# Patient Record
Sex: Female | Born: 1955 | Race: White | Hispanic: No | Smoking: Never smoker
Health system: Southern US, Community
[De-identification: ages and names within clinical notes are randomized; demographics above are authoritative.]

## PROBLEM LIST (undated history)

## (undated) DIAGNOSIS — K900 Celiac disease: Secondary | ICD-10-CM

## (undated) DIAGNOSIS — Z862 Personal history of diseases of the blood and blood-forming organs and certain disorders involving the immune mechanism: Secondary | ICD-10-CM

## (undated) DIAGNOSIS — J45909 Unspecified asthma, uncomplicated: Secondary | ICD-10-CM

## (undated) HISTORY — DX: Unspecified asthma, uncomplicated: J45.909

## (undated) HISTORY — DX: Celiac disease: K90.0

## (undated) HISTORY — DX: Personal history of diseases of the blood and blood-forming organs and certain disorders involving the immune mechanism: Z86.2

---

## 1995-09-02 HISTORY — PX: TUBAL LIGATION: SHX77

## 1999-05-28 ENCOUNTER — Other Ambulatory Visit: Admission: RE | Admit: 1999-05-28 | Discharge: 1999-05-28 | Payer: Self-pay | Admitting: Gynecology

## 2000-06-30 ENCOUNTER — Other Ambulatory Visit: Admission: RE | Admit: 2000-06-30 | Discharge: 2000-06-30 | Payer: Self-pay | Admitting: Gynecology

## 2001-11-23 ENCOUNTER — Other Ambulatory Visit: Admission: RE | Admit: 2001-11-23 | Discharge: 2001-11-23 | Payer: Self-pay | Admitting: Gynecology

## 2002-12-19 ENCOUNTER — Other Ambulatory Visit: Admission: RE | Admit: 2002-12-19 | Discharge: 2002-12-19 | Payer: Self-pay | Admitting: Gynecology

## 2003-12-14 ENCOUNTER — Other Ambulatory Visit: Admission: RE | Admit: 2003-12-14 | Discharge: 2003-12-14 | Payer: Self-pay | Admitting: Gynecology

## 2004-06-06 ENCOUNTER — Other Ambulatory Visit: Admission: RE | Admit: 2004-06-06 | Discharge: 2004-06-06 | Payer: Self-pay | Admitting: Gynecology

## 2004-12-24 ENCOUNTER — Ambulatory Visit: Payer: Self-pay | Admitting: Oncology

## 2005-01-16 ENCOUNTER — Other Ambulatory Visit: Admission: RE | Admit: 2005-01-16 | Discharge: 2005-01-16 | Payer: Self-pay | Admitting: Gynecology

## 2006-04-16 ENCOUNTER — Other Ambulatory Visit: Admission: RE | Admit: 2006-04-16 | Discharge: 2006-04-16 | Payer: Self-pay | Admitting: Gynecology

## 2007-06-08 ENCOUNTER — Other Ambulatory Visit: Admission: RE | Admit: 2007-06-08 | Discharge: 2007-06-08 | Payer: Self-pay | Admitting: Gynecology

## 2008-07-17 ENCOUNTER — Other Ambulatory Visit: Admission: RE | Admit: 2008-07-17 | Discharge: 2008-07-17 | Payer: Self-pay | Admitting: Gynecology

## 2008-07-21 ENCOUNTER — Encounter: Admission: RE | Admit: 2008-07-21 | Discharge: 2008-07-21 | Payer: Self-pay | Admitting: Family Medicine

## 2014-04-11 ENCOUNTER — Ambulatory Visit
Admission: RE | Admit: 2014-04-11 | Discharge: 2014-04-11 | Disposition: A | Discharge status: Home / Self Care | Payer: PRIVATE HEALTH INSURANCE | Source: Ambulatory Visit | Attending: Family Medicine | Admitting: Family Medicine

## 2014-04-11 ENCOUNTER — Other Ambulatory Visit: Payer: Self-pay | Admitting: Family Medicine

## 2014-04-11 DIAGNOSIS — R109 Unspecified abdominal pain: Secondary | ICD-10-CM

## 2015-03-12 ENCOUNTER — Other Ambulatory Visit: Payer: Self-pay | Admitting: Family Medicine

## 2015-03-12 DIAGNOSIS — R911 Solitary pulmonary nodule: Secondary | ICD-10-CM

## 2015-09-17 ENCOUNTER — Other Ambulatory Visit: Payer: Self-pay | Admitting: Physician Assistant

## 2015-09-17 DIAGNOSIS — R911 Solitary pulmonary nodule: Secondary | ICD-10-CM

## 2015-10-01 ENCOUNTER — Ambulatory Visit
Admission: RE | Admit: 2015-10-01 | Discharge: 2015-10-01 | Disposition: A | Discharge status: Home / Self Care | Payer: PRIVATE HEALTH INSURANCE | Source: Ambulatory Visit | Attending: Physician Assistant | Admitting: Physician Assistant

## 2015-10-01 DIAGNOSIS — R911 Solitary pulmonary nodule: Secondary | ICD-10-CM

## 2016-05-08 IMAGING — CT CT CHEST W/O CM
1 series · 15 of 31 positions shown, 19 images · non-contrast
Comparison: CT abdomen pelvis 04/11/2014

CLINICAL DATA: Followup lung nodule

EXAM:
CT CHEST WITHOUT CONTRAST
TECHNIQUE: Multidetector CT imaging of the chest was performed following the
standard protocol without IV contrast.

[Series 2: chest w/(date) · axial · 0.61mm/px · z∈[-326,-52]mm · 15 of 61 slices shown, 19 images]
[im 3/61  mediastinal]
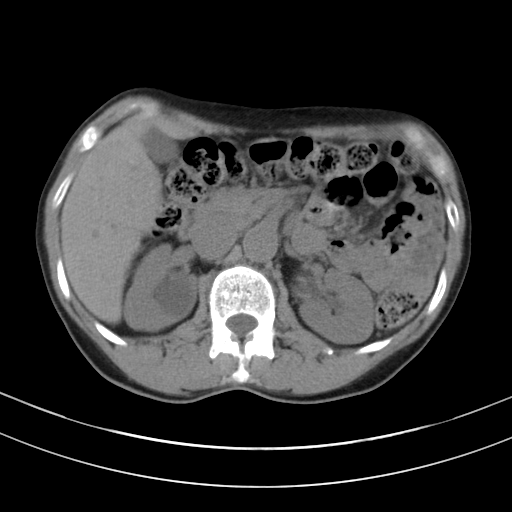
[im 3/61  lung]
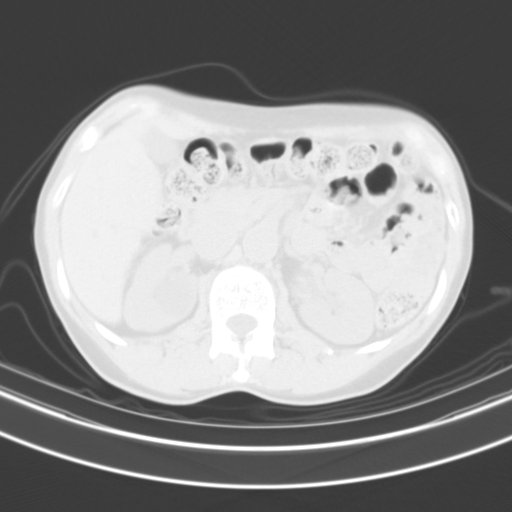
[im 7/61  lung]
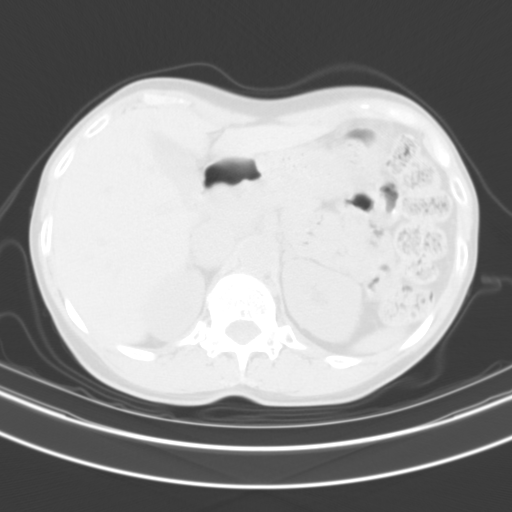
[im 12/61  lung]
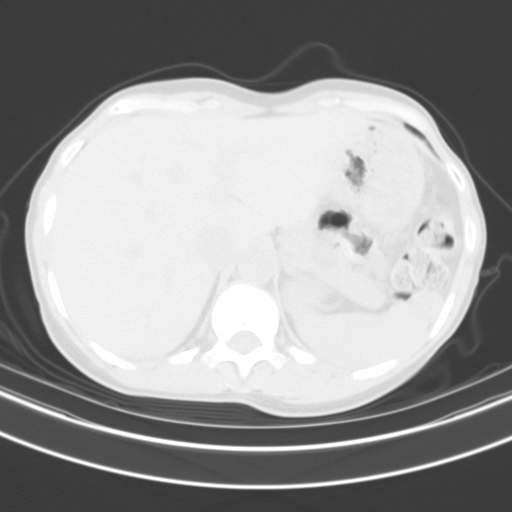
[im 14/61  lung]
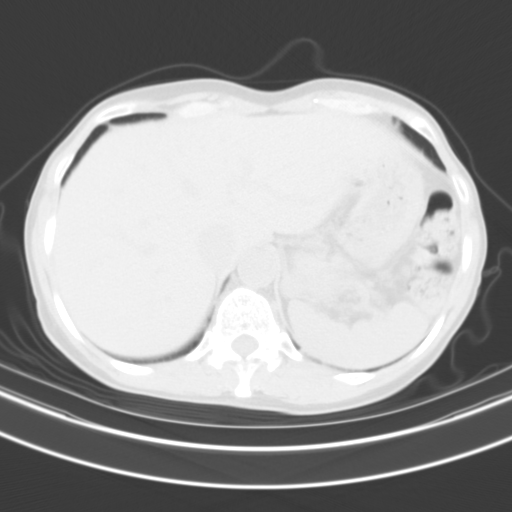
[im 18/61  mediastinal]
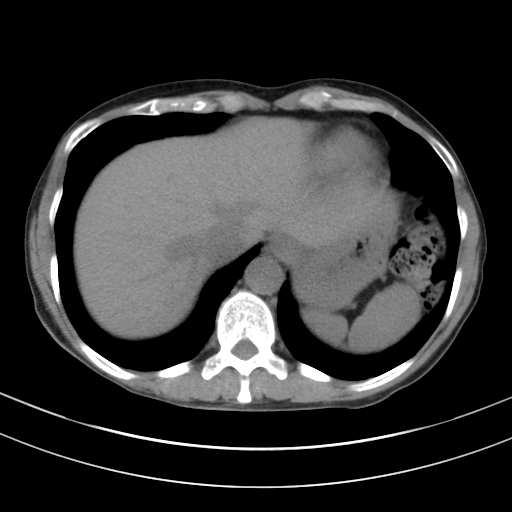
[im 18/61  lung]
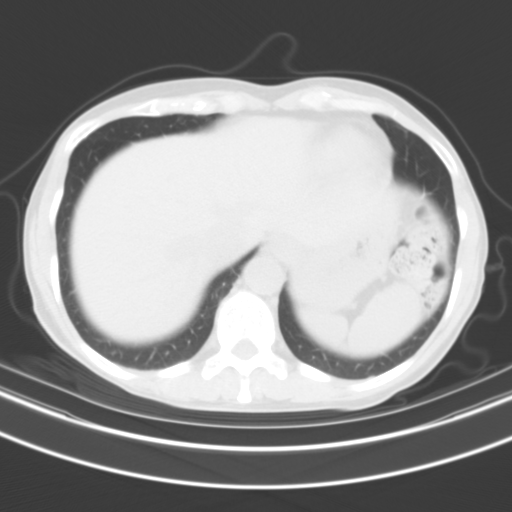
[im 23/61  lung]
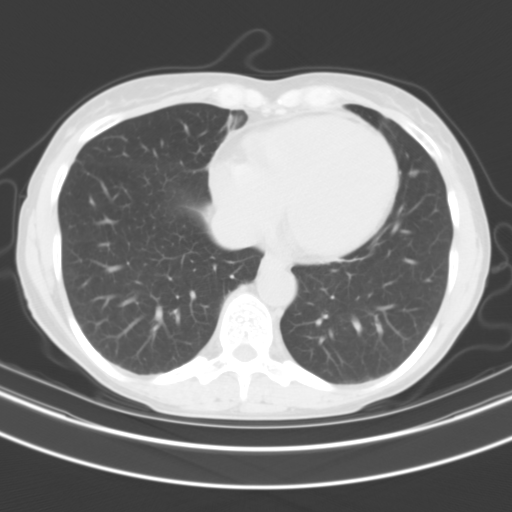
[im 27/61  lung]
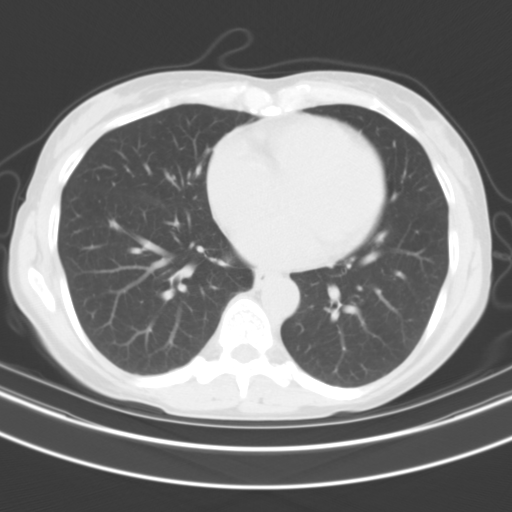
[im 32/61  lung]
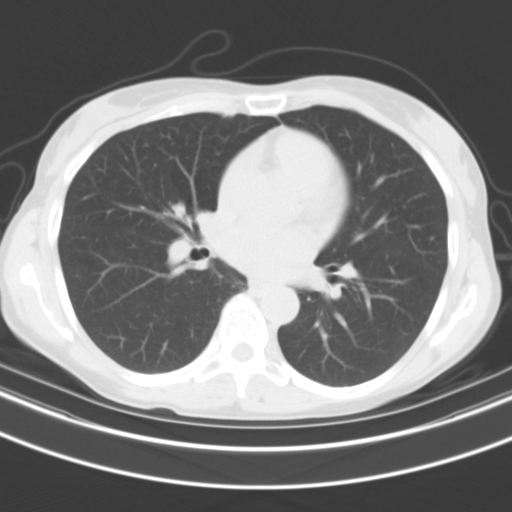
[im 34/61  mediastinal]
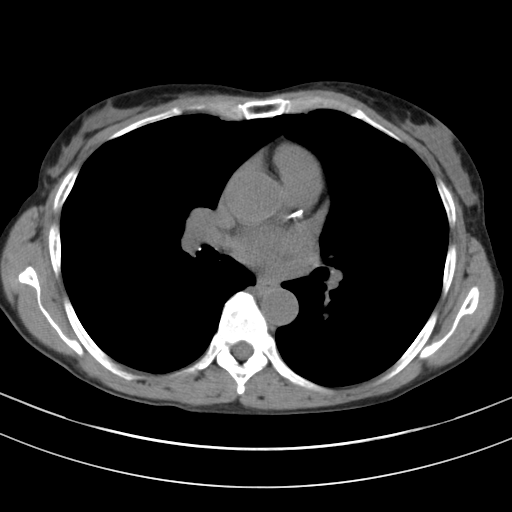
[im 34/61  lung]
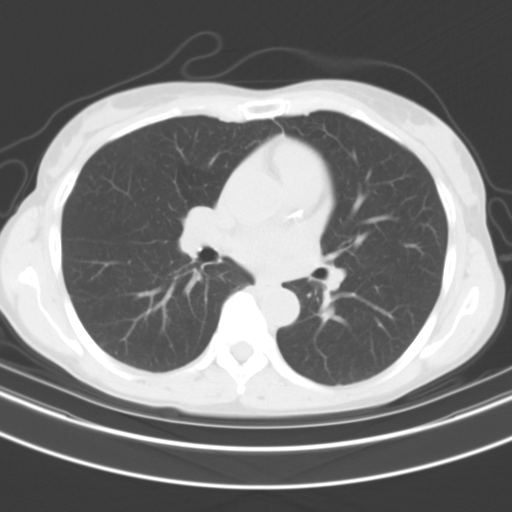
[im 37/61  lung]
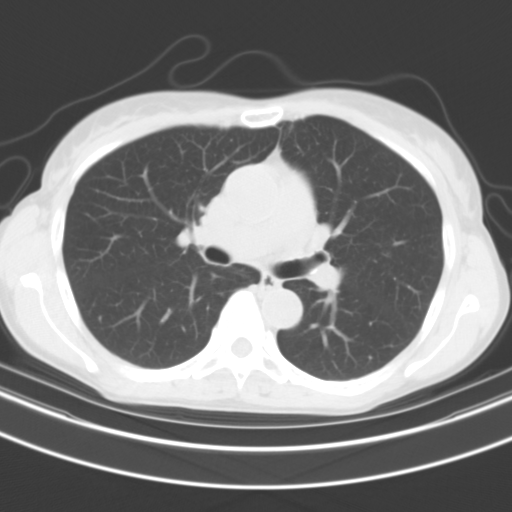
[im 41/61  lung]
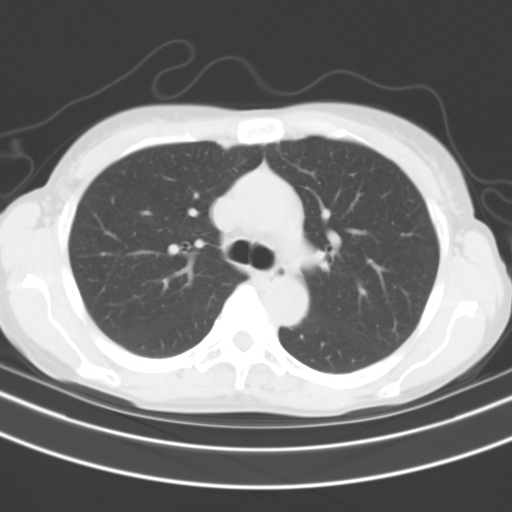
[im 45/61  lung]
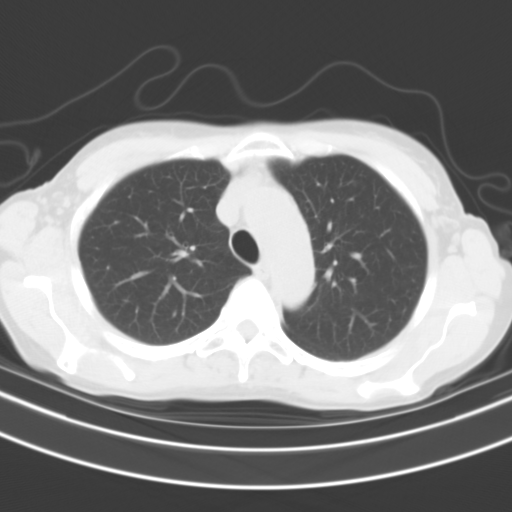
[im 49/61  mediastinal]
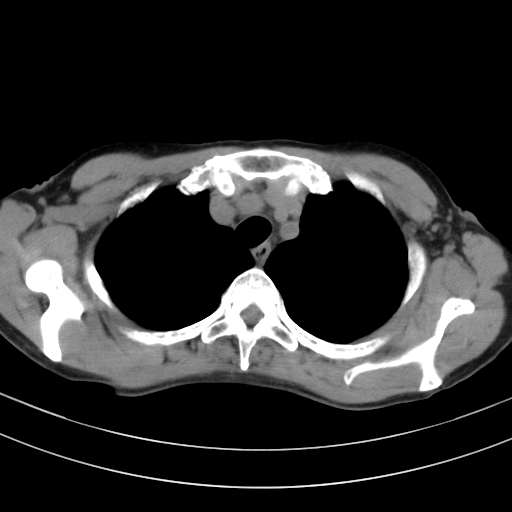
[im 49/61  lung]
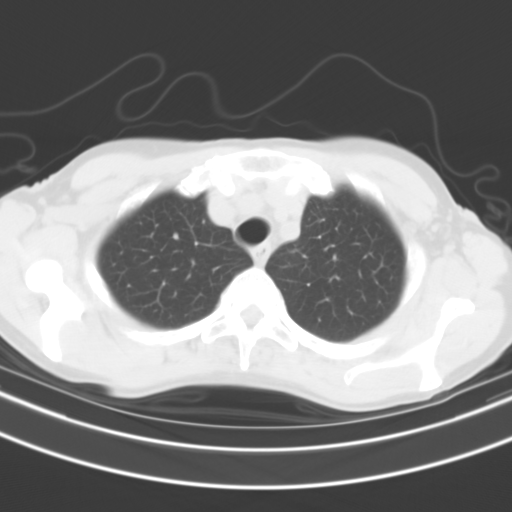
[im 54/61  lung]
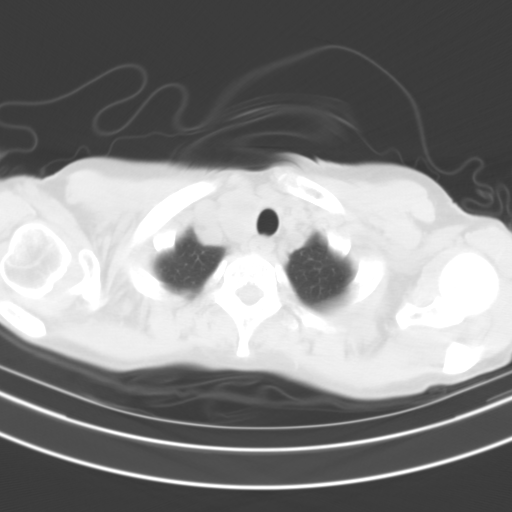
[im 58/61  lung]
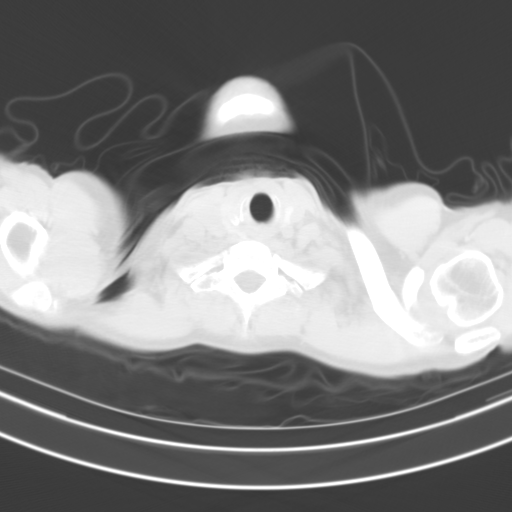

[15 of 31 positions shown; findings below may reference images not displayed]

FINDINGS: 4 x 5 mm nodule in the region of the lingula is unchanged. This is
associated with the major fissure and may represent a lymph node. No
new nodules identified. Negative for lung mass.

Mild scarring in the right middle lobe unchanged. Negative for
pneumonia.

No pleural effusion.  No mediastinal adenopathy.

Right thyroid nodule measures 22 x 30 mm. This may represent a
benign or malignant thyroid nodule. Thyroid ultrasound with biopsy
suggested for further evaluation.

Right renal cyst measures 28 mm, slightly larger compared with the
prior study. No mass in the upper abdomen.

Thoracic mild disc degeneration without fracture or bone lesion.
IMPRESSION: 4 x 5 mm nodule along the major fissure on the left in the region
the lingula is stable. This may represent a lymph node or other
benign nodule. No further follow-up is necessary for this nodule.

## 2016-07-17 ENCOUNTER — Encounter: Payer: Self-pay | Admitting: Pulmonary Disease

## 2016-07-17 ENCOUNTER — Ambulatory Visit (INDEPENDENT_AMBULATORY_CARE_PROVIDER_SITE_OTHER): Payer: PRIVATE HEALTH INSURANCE | Admitting: Pulmonary Disease

## 2016-07-17 ENCOUNTER — Ambulatory Visit (INDEPENDENT_AMBULATORY_CARE_PROVIDER_SITE_OTHER)
Admission: RE | Admit: 2016-07-17 | Discharge: 2016-07-17 | Disposition: A | Discharge status: Home / Self Care | Payer: PRIVATE HEALTH INSURANCE | Source: Ambulatory Visit | Attending: Pulmonary Disease | Admitting: Pulmonary Disease

## 2016-07-17 VITALS — BP 102/68 | HR 59 | Ht 63.0 in | Wt 111.4 lb

## 2016-07-17 DIAGNOSIS — R05 Cough: Secondary | ICD-10-CM

## 2016-07-17 DIAGNOSIS — R059 Cough, unspecified: Secondary | ICD-10-CM

## 2016-07-17 DIAGNOSIS — J45991 Cough variant asthma: Secondary | ICD-10-CM | POA: Insufficient documentation

## 2016-07-17 MED ORDER — IPRATROPIUM-ALBUTEROL 0.5-2.5 (3) MG/3ML IN SOLN: 3.0000 mL | Freq: Four times a day (QID) | RESPIRATORY_TRACT | 2 refills | Status: DC | PRN

## 2016-07-17 NOTE — Assessment & Plan Note (Signed)
Kirsten Fleming has a past medical history significant for mild intermittent asthma which is been quiet for years but this year she says she feels like symptoms are starting to return again. Specifically, she's had a significant increase in cough over the last several months. There is been no change in her environment that she's aware of. Specifically, she says that there is been no water damage, no new Paps, no changes at work. She does not smoke nor has she ever smoked.  It sounds as if a significant portion of the cough is coming from an irritation in her throat, but she is quite insistent this is similar to her prior episode of cough variant asthma.  We talked about postnasal drip and heartburn but she denies either of these.  Plan: DuoNeb as needed for chest tightness and cough, she says this worked well in the past Chest x-ray today Follow-up if no improvement or if worse

## 2016-07-17 NOTE — Progress Notes (Signed)
Subjective:    Patient ID: Kirsten Fleming, female    DOB: 12/23/1955, 60 y.o.   MRN: [id number]  HPI Chief Complaint  Patient presents with  . Pulmonary Consult    Pt is a self referral for asthma. Pt previously seen [facility] for asthma. Pt c/o increase in cough - nonprod. Pt denies SOB, CP/tightness and wheezing.     Kirsten Fleming has asthma which started when she had a bad cold which would lead to a wheeze.    Now she says that she has a cough typically at 4PM in the afternoon, worse when she is in a crowded room. This is associated with a scratchy throat. > no dyspnea now > feels like the cough she had in the past with her asthma > her neck itches on the outside  She has a history of eczema but she says this has not been a problem for her recently. She says that she just feels an itchy sensation around her neck and then she starts to cough. She denies sinus congestion postnasal drip or heartburn. No acid reflux symptoms that she is aware of.  She is a lifelong nonsmoker.  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 She says there is been no changes to her environment lately.  She said that she saw Dr. Maple HudsonYoung for asthma 20 years ago and a DuoNeb nebulizer worked well for these exact symptoms.    Past Medical History:  Diagnosis Date  . Asthma   . Celiac disease   . History of ITP      Family History  Problem Relation Age of Onset  . Colon cancer Mother   . Breast cancer Mother   . Heart attack Brother      Social History   Social History  . Marital status: Significant Other    Spouse name: N/A  . Number of children: N/A  . Years of education: N/A   Occupational History  . Not on file.   Social History Main Topics  . Smoking status: Never Smoker  . Smokeless tobacco: Never Used  . Alcohol use No  . Drug use: No  . Sexual activity: Not on file   Other Topics Concern  . 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   No facility-administered medications prior to visit.       Review of Systems  Constitutional: Negative for fever and unexpected weight change.  HENT: Negative for congestion, dental problem, ear pain, nosebleeds, postnasal drip, rhinorrhea, sinus pressure, sneezing, sore throat and trouble swallowing.   Eyes: Negative for redness and itching.  Respiratory: Positive for cough. Negative for chest tightness, shortness of breath and wheezing.   Cardiovascular: Negative for palpitations and leg swelling.  Gastrointestinal: Negative for nausea and vomiting.  Genitourinary: Negative for dysuria.  Musculoskeletal: Negative for joint swelling.  Skin: Negative for rash.  Neurological: Negative for headaches.  Hematological: Does not bruise/bleed easily.  Psychiatric/Behavioral: Negative for dysphoric mood. The patient is not nervous/anxious.        Objective:   Physical Exam Vitals:   07/17/16 1219  BP: 102/68  Pulse: (!) 59  SpO2: 98%  Weight: 111 lb 6.4 oz (50.5 kg)  Height: 5\' 3"  (1.6 m)    Gen: well appearing, no acute distress HENT: NCAT, OP clear, neck supple without  masses Eyes: PERRL, EOMi Lymph: no cervical lymphadenopathy PULM: CTA B CV: RRR, no mgr, no JVD GI: BS+, soft, nontender, no hsm Derm: no rash or skin breakdown MSK: normal bulk and tone Neuro: A&Ox4, CN II-XII intact, strength 5/5 in all 4 extremities Psyche: normal mood and affect  CT chest from January 2017 images personally reviewed showing small subcentimeter nodules bilaterally      Assessment & Plan:  Cough variant asthma Kirsten Fleming has a past medical history significant for mild intermittent asthma which is been quiet for years but this year she says she feels like symptoms are starting to return again. Specifically, she's had a significant increase in cough over the last several months. There is been no change in her environment that  she's aware of. Specifically, she says that there is been no water damage, no new Paps, no changes at work. She does not smoke nor has she ever smoked.  It sounds as if a significant portion of the cough is coming from an irritation in her throat, but she is quite insistent this is similar to her prior episode of cough variant asthma.  We talked about postnasal drip and heartburn but she denies either of these.  Plan: DuoNeb as needed for chest tightness and cough, she says this worked well in the past Chest x-ray today Follow-up if no improvement or if worse     Current Outpatient Prescriptions:  .  Cholecalciferol (VITAMIN D3) 1000 units CAPS, Take 2,000 capsules by mouth daily., Disp: , Rfl:  .  fluvoxaMINE (LUVOX) 100 MG tablet, Take 150 mg by mouth at bedtime., Disp: , Rfl:  .  hyoscyamine (LEVSIN, ANASPAZ) 0.125 MG tablet, Take 0.0625 mg by mouth every 4 (four) hours as needed., Disp: , Rfl:  .  LORazepam (ATIVAN) 0.5 MG tablet, Take 0.5 mg by mouth every 8 (eight) hours., Disp: , Rfl:  .  Multiple Vitamin (MULTIVITAMIN) capsule, Take 1 capsule by mouth daily., Disp: , Rfl:  .  terbinafine (LAMISIL) 250 MG tablet, Take 250 mg by mouth daily., Disp: , Rfl:  .  ipratropium-albuterol (DUONEB) 0.5-2.5 (3) MG/3ML SOLN, Take 3 mLs by nebulization 4 (four) times daily as needed., Disp: 360 mL, Rfl: 2

## 2016-07-17 NOTE — Patient Instructions (Signed)
We will call you with the results of the chest x-ray Use the DuoNeb nebulizer as needed for cough, chest tightness, wheezing, or shortness of breath We will see you back if your symptoms worsen

## 2016-07-21 ENCOUNTER — Telehealth: Payer: Self-pay | Admitting: Pulmonary Disease

## 2016-07-21 NOTE — Telephone Encounter (Signed)
Notes Recorded by Lupita Leashouglas B McQuaid, MD on 07/18/2016 at 6:04 AM EST A, Please let the patient know this was OK Thanks,  Called and spoke with pt and she is aware of results of cxr . Nothing further is needed.

## 2017-02-22 IMAGING — DX DG CHEST 2V
2 series · 2 of 2 positions shown · non-contrast
Comparison: CT from 10/01/2015

CLINICAL DATA: Asthma and lung mass history of left lung mass.
Routine follow-up.

EXAM:
CHEST  2 VIEW

[chest pa]
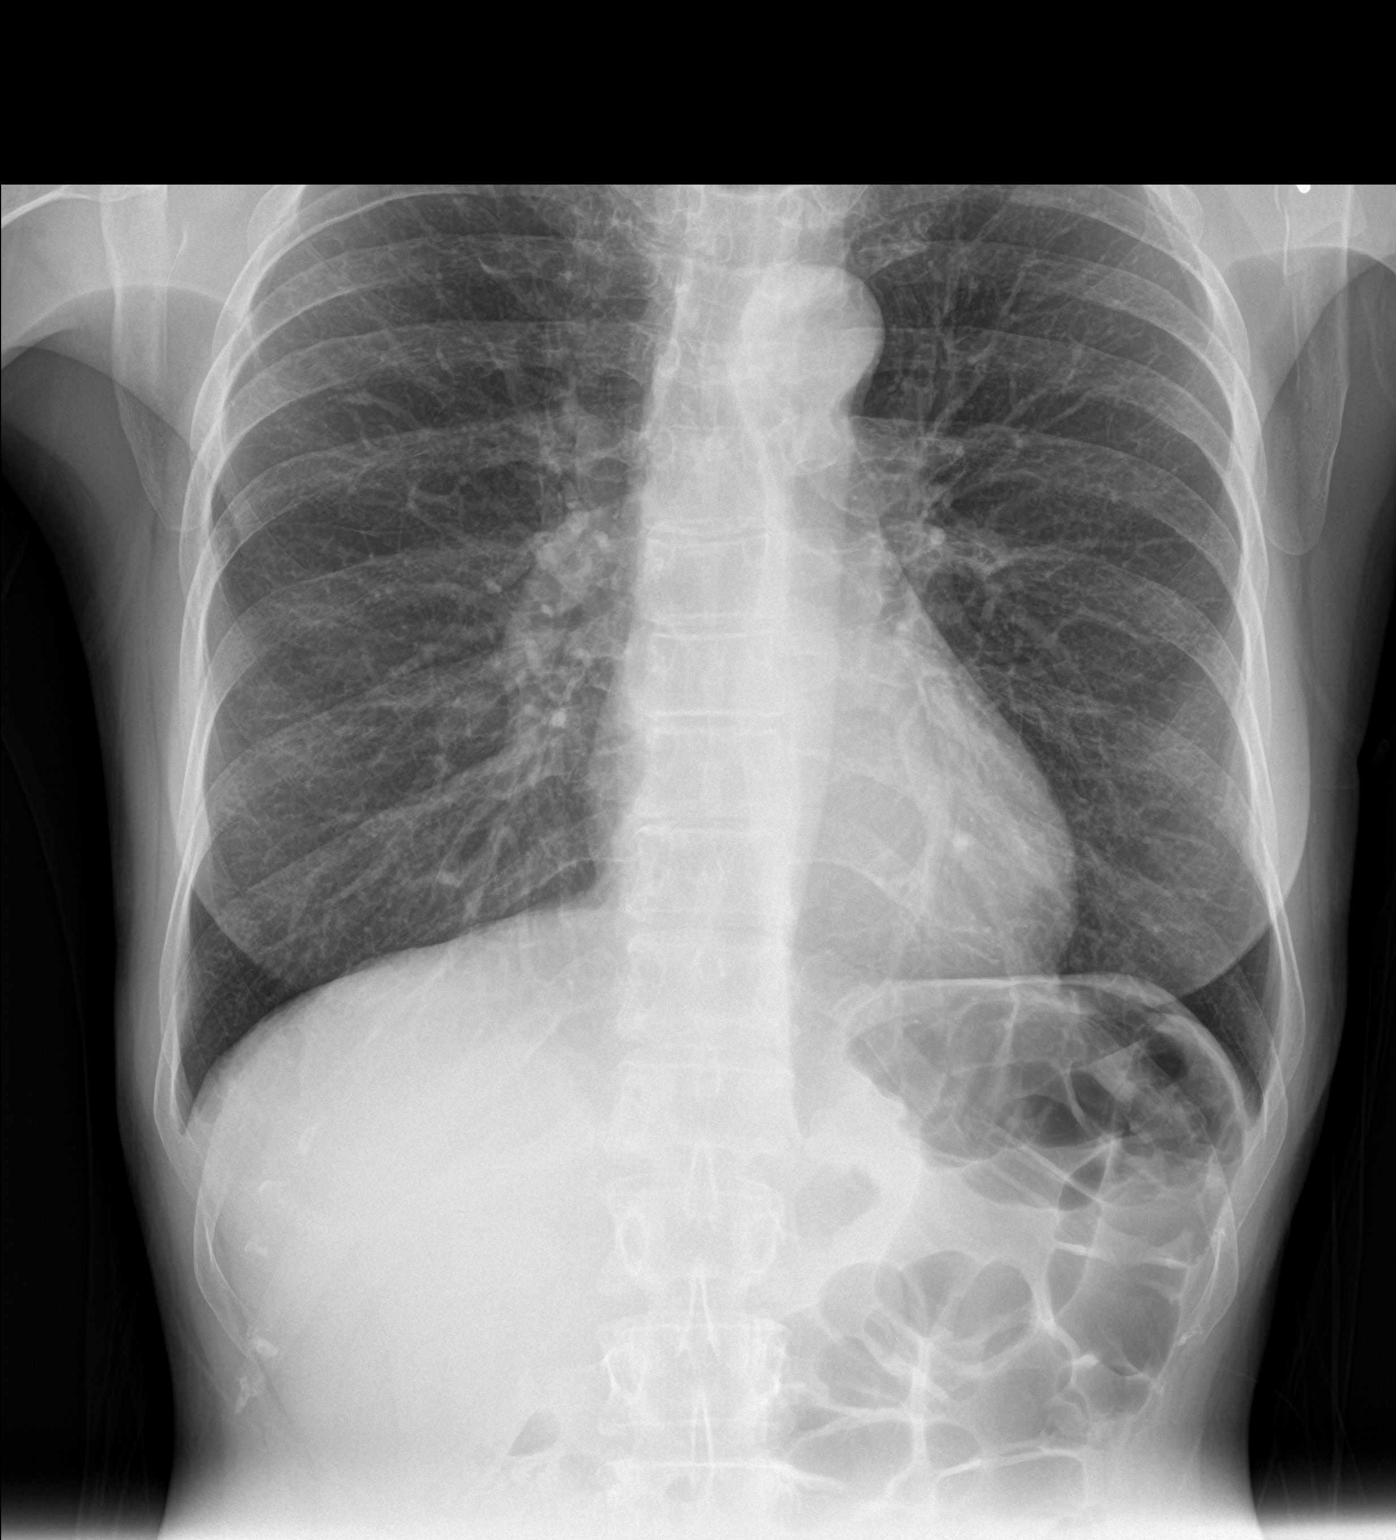

[chest lat]
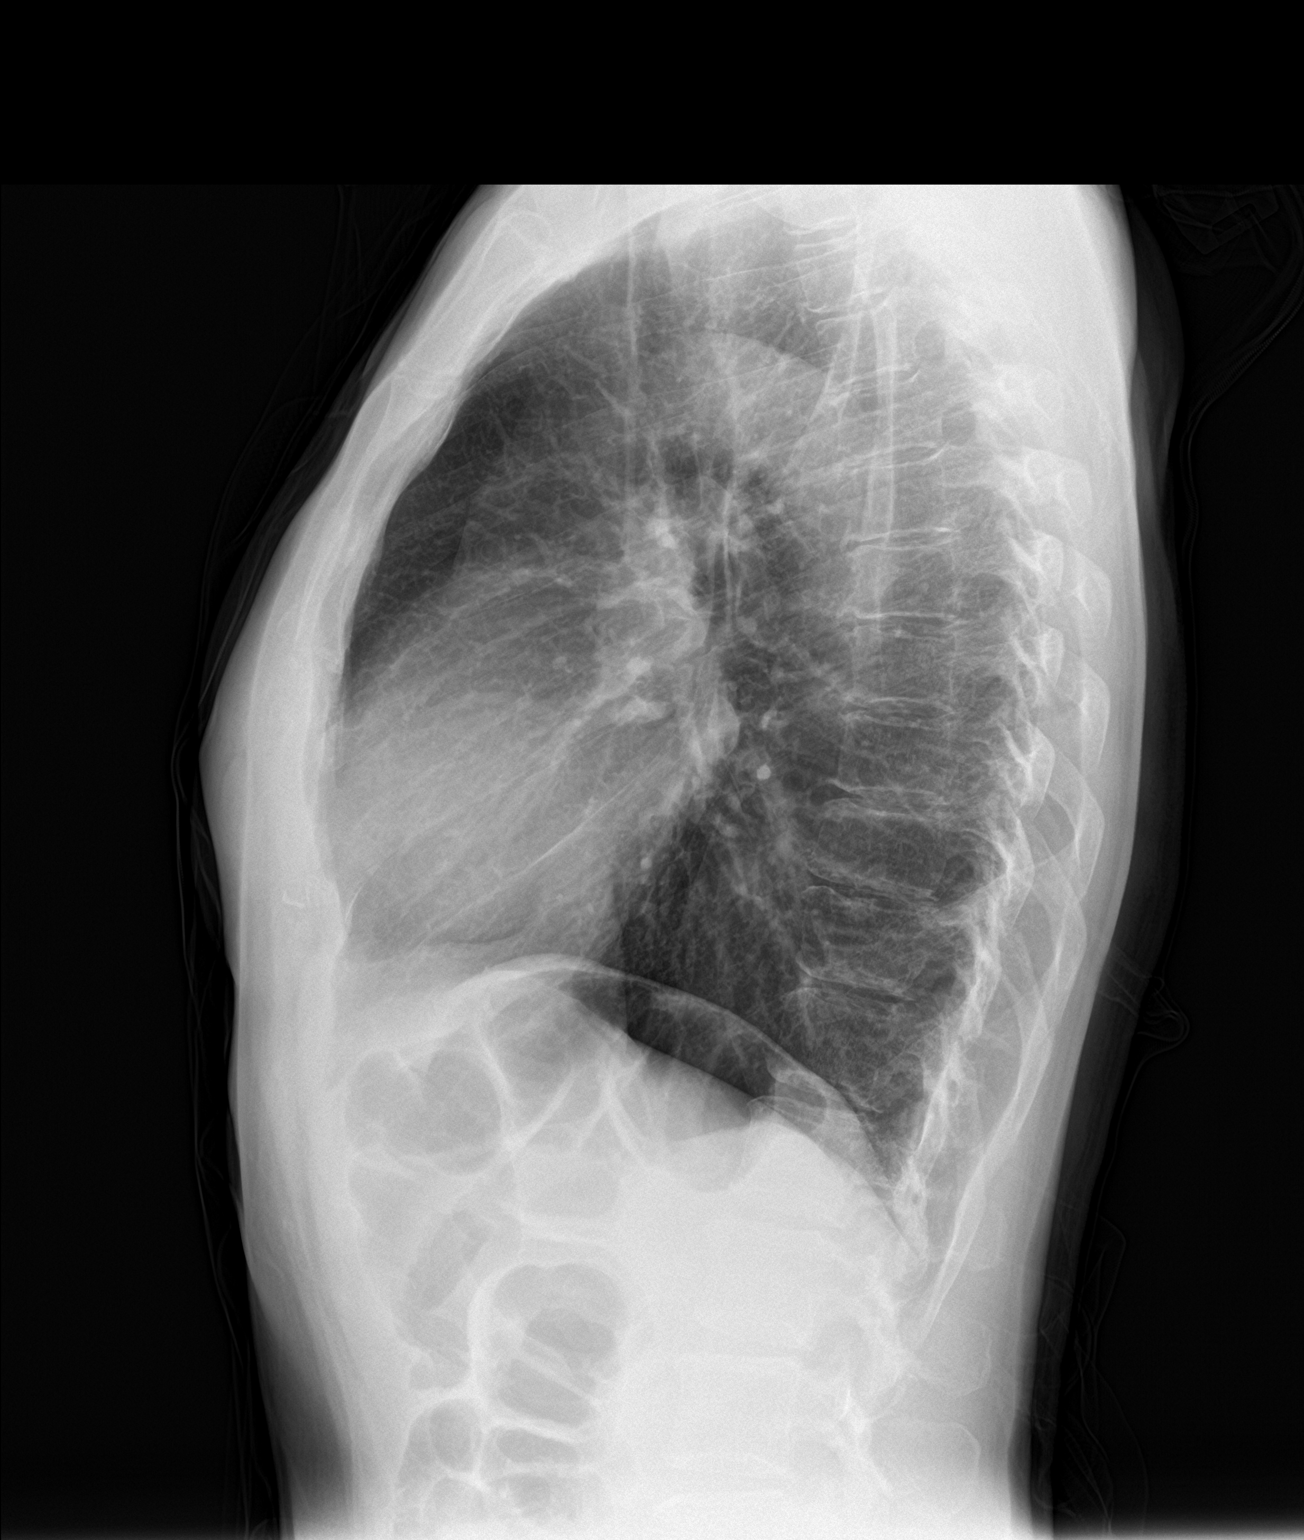

[2 of 2 positions shown; findings below may reference images not displayed]

FINDINGS: The heart size and mediastinal contours are within normal limits.
Both lungs are clear. No dominant mass is identified
radiographically. No effusion or pneumothorax. The visualized
skeletal structures are unremarkable.
IMPRESSION: No active cardiopulmonary disease.

## 2017-11-06 ENCOUNTER — Ambulatory Visit: Payer: PRIVATE HEALTH INSURANCE | Admitting: Emergency Medicine

## 2017-11-06 ENCOUNTER — Encounter: Payer: Self-pay | Admitting: Emergency Medicine

## 2017-11-06 DIAGNOSIS — J45991 Cough variant asthma: Secondary | ICD-10-CM

## 2017-11-06 MED ORDER — PREDNISONE 10 MG PO TABS: ORAL_TABLET | ORAL | 0 refills | Status: DC

## 2017-11-06 NOTE — Progress Notes (Signed)
Subjective:    Patient ID: Dallas BreedingJeanne Krukowski, female    DOB: [birth year], 62 y.o.   MRN: [id number]  HPI 62 year old never smoker has been followed in the past by Dr. Maple HudsonYoung and then Dr. Kendrick FriesMcQuaid for history of asthma.  I do not see any pulmonary function testing.  The diagnosis was made clinically many years ago. Last seen here 2107  She was well until about 2 weeks ago developed hoarseness and a dry cough after trip to [place], started on her return. She is outside frequently, likes to hike. The cough worst in the evening. Woke her from sleep. She used albuterol nebs - some brief relief. She was started on pred 40mg  x 5 days + doxycycline. Improved some. Then she was exposed to a URI last week > nasal gtt, clear mucous. No real dyspnea, having some hoarseness again. She was told yesterday to start pulmicort - hasn't done so yet.     Review of Systems  Past Medical History:  Diagnosis Date  . Asthma   . Celiac disease   . History of ITP      Family History  Problem Relation Age of Onset  . Colon cancer Mother   . Breast cancer Mother   . Heart attack Brother      Social History   Socioeconomic History  . Marital status: Significant Other    Spouse name: Not on file  . Number of children: Not on file  . Years of education: Not on file  . Highest education level: Not on file  Social Needs  . Financial resource strain: Not on file  . Food insecurity - worry: Not on file  . Food insecurity - inability: Not on file  . Transportation needs - medical: Not on file  . Transportation needs - non-medical: Not on file  Occupational History  . Not on file  Tobacco Use  . Smoking status: Never Smoker  . Smokeless tobacco: Never Used  Substance and Sexual Activity  . Alcohol use: No  . Drug use: No  . Sexual activity: Not on file  Other Topics Concern  . 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 Asa [Aspirin]       Outpatient Medications Prior to Visit  Medication Sig Dispense Refill  . albuterol (PROVENTIL) (2.5 MG/3ML) 0.083% nebulizer solution Take 2.5 mg by nebulization every 6 (six) hours as needed for wheezing or shortness of breath.    . Cholecalciferol (VITAMIN D3) 1000 units CAPS Take 2,000 capsules by mouth daily.    . fluvoxaMINE (LUVOX) 100 MG tablet Take 150 mg by mouth at bedtime.    Marland Kitchen. ipratropium-albuterol (DUONEB) 0.5-2.5 (3) MG/3ML SOLN Take 3 mLs by nebulization 4 (four) times daily as needed. 360 mL 2  . LORazepam (ATIVAN) 0.5 MG tablet Take 0.5 mg by mouth every 8 (eight) hours.    . Multiple Vitamin (MULTIVITAMIN) capsule Take 1 capsule by mouth daily.    . hyoscyamine (LEVSIN, ANASPAZ) 0.125 MG tablet Take 0.0625 mg by mouth every 4 (four) hours as needed.    . terbinafine (LAMISIL) 250 MG tablet Take 250 mg by mouth daily.     No facility-administered medications prior to visit.          Objective:   Physical Exam Vitals:   11/06/17 1359  BP: 100/68  Pulse: 70  SpO2: 99%  Weight: 112 lb (50.8 kg)  Height: 5'  3" (1.6 m)   Gen: Pleasant, thin, in no distress,  normal affect  ENT: No lesions,  mouth clear,  oropharynx clear, no postnasal drip  Neck: No JVD, no stridor  Lungs: No use of accessory muscles, no wheeze or crackles.   Cardiovascular: RRR, heart sounds normal, no murmur or gallops, no peripheral edema  Musculoskeletal: No deformities, no cyanosis or clubbing  Neuro: alert, non focal  Skin: Warm, no lesions or rash    Assessment & Plan:  Cough variant asthma She has an acute exacerbation that was beginning to resolve until she then developed a viral upper respiratory infection that she got from her grandson.  She now has lost ground, is coughing.  She does get some relief from albuterol and we will refill this.  I will treat her with an extended pred taper.  Recommended that she use OTC sx relief - she may decide to defer because she is sensitive to  these meds.   Take prednisone as directed until completely gone. You may want to consider trying over-the-counter medication for symptom management like Tylenol Cold and flu, TheraFlu, if you believe you can tolerate these medicines. Do not start Pulmicort. Use albuterol nebulized up to every 4 hours if needed for wheezing, chest tightness, shortness of breath, coughing spells Call our office if you have not improved after 1 week.  Levy Pupa, MD, PhD 11/06/2017, 2:23 PM [facility] [phone] or if no answer [phone]

## 2017-11-06 NOTE — Assessment & Plan Note (Signed)
She has an acute exacerbation that was beginning to resolve until she then developed a viral upper respiratory infection that she got from her grandson.  She now has lost ground, is coughing.  She does get some relief from albuterol and we will refill this.  I will treat her with an extended pred taper.  Recommended that she use OTC sx relief - she may decide to defer because she is sensitive to these meds.   Take prednisone as directed until completely gone. You may want to consider trying over-the-counter medication for symptom management like Tylenol Cold and flu, TheraFlu, if you believe you can tolerate these medicines. Do not start Pulmicort. Use albuterol nebulized up to every 4 hours if needed for wheezing, chest tightness, shortness of breath, coughing spells Call our office if you have not improved after 1 week.

## 2017-11-06 NOTE — Patient Instructions (Addendum)
Take prednisone as directed until completely gone. You may want to consider trying over-the-counter medication for symptom management like Tylenol Cold and flu, TheraFlu, if you believe you can tolerate these medicines. Do not start Pulmicort. Use albuterol nebulized up to every 4 hours if needed for wheezing, chest tightness, shortness of breath, coughing spells Call our office if you have not improved after 1 week.

## 2017-11-07 ENCOUNTER — Other Ambulatory Visit: Payer: Self-pay | Admitting: Pulmonary Disease

## 2019-07-07 ENCOUNTER — Other Ambulatory Visit: Payer: Self-pay | Admitting: Family Medicine

## 2019-07-07 DIAGNOSIS — E041 Nontoxic single thyroid nodule: Secondary | ICD-10-CM

## 2019-09-22 ENCOUNTER — Ambulatory Visit
Admission: RE | Admit: 2019-09-22 | Discharge: 2019-09-22 | Disposition: A | Discharge status: Home / Self Care | Payer: PRIVATE HEALTH INSURANCE | Source: Ambulatory Visit | Attending: Family Medicine | Admitting: Family Medicine

## 2019-09-22 DIAGNOSIS — E041 Nontoxic single thyroid nodule: Secondary | ICD-10-CM

## 2019-10-13 ENCOUNTER — Encounter: Payer: Self-pay | Admitting: Family Medicine

## 2019-10-13 ENCOUNTER — Ambulatory Visit: Payer: PRIVATE HEALTH INSURANCE | Admitting: Family Medicine

## 2019-10-13 ENCOUNTER — Other Ambulatory Visit: Payer: Self-pay | Admitting: Family Medicine

## 2019-10-13 VITALS — BP 128/78 | HR 56 | Resp 16

## 2019-10-13 DIAGNOSIS — Z789 Other specified health status: Secondary | ICD-10-CM

## 2019-10-13 DIAGNOSIS — E559 Vitamin D deficiency, unspecified: Secondary | ICD-10-CM

## 2019-10-13 NOTE — Progress Notes (Signed)
Subjective:     Patient ID: Kirsten Fleming, female   DOB: [birth year], 64 y.o.   MRN: [id number]  HPI  Kirsten Fleming presents to the employee health clinic today for her required wellness visit per her insurance. She has a PCP, Dr. Gweneth Fleming who she sees regularly. She reports colonoscopy, mammogram, and pap smear are UTD. She denies any current problems or concerns.   She reports hx of vitamin d deficiency and was taking a vitamin D supplement. States PCP checked her vitamin D level and it was high in November 2020 and wanted her to stop her supplement and have a recheck in 3 months.   Past Medical History:  Diagnosis Date  . Asthma   . Celiac disease   . History of ITP    Allergies  Allergen Reactions  . Asa [Aspirin]     Current Outpatient Medications:  .  albuterol (PROVENTIL) (2.5 MG/3ML) 0.083% nebulizer solution, Take 2.5 mg by nebulization every 6 (six) hours as needed for wheezing or shortness of breath., Disp: , Rfl:  .  alendronate (FOSAMAX) 70 MG tablet, Take 70 mg by mouth once a week., Disp: , Rfl:  .  escitalopram (LEXAPRO) 10 MG tablet, Take 10 mg by mouth daily., Disp: , Rfl:  .  ipratropium-albuterol (DUONEB) 0.5-2.5 (3) MG/3ML SOLN, USE ONE VIAL IN NEBULIZER 4 TIMES DAILY AS NEEDED, Disp: 360 mL, Rfl: 2 .  LORazepam (ATIVAN) 0.5 MG tablet, Take 0.5 mg by mouth every 8 (eight) hours., Disp: , Rfl:  .  Multiple Vitamin (MULTIVITAMIN) capsule, Take 1 capsule by mouth daily., Disp: , Rfl:    Review of Systems  Constitutional: Negative for chills, fatigue, fever and unexpected weight change.  HENT: Negative for congestion, ear pain, sinus pressure, sinus pain and sore throat.   Eyes: Negative for discharge and visual disturbance.  Respiratory: Negative for cough, shortness of breath and wheezing.   Cardiovascular: Negative for chest pain and leg swelling.  Gastrointestinal: Negative for abdominal pain, blood in stool, constipation, diarrhea, nausea and vomiting.   Genitourinary: Negative for difficulty urinating and hematuria.  Skin: Negative for color change.  Neurological: Negative for dizziness, weakness, light-headedness and headaches.  Hematological: Negative for adenopathy.  All other systems reviewed and are negative.      Objective:   Physical Exam Vitals and nursing note reviewed.  Constitutional:      General: She is not in acute distress.    Appearance: She is well-developed.  HENT:     Head: Normocephalic and atraumatic.  Cardiovascular:     Rate and Rhythm: Normal rate and regular rhythm.     Heart sounds: Normal heart sounds. No murmur.  Pulmonary:     Effort: Pulmonary effort is normal. No respiratory distress.     Breath sounds: Normal breath sounds.  Musculoskeletal:     Cervical back: Neck supple.  Skin:    General: Skin is warm and dry.  Neurological:     Mental Status: She is alert and oriented to person, place, and time.  Psychiatric:        Mood and Affect: Mood normal.        Behavior: Behavior normal.    Today's Vitals   10/13/19 1021  BP: 128/78  Pulse: (!) 56  Resp: 16  SpO2: 97%   There is no height or weight on file to calculate BMI.      Assessment:     1. Participant in health and wellness plan Keep all regular appts with  PCP. Encouraged healthy diet and exercise. F/u here prn.   2. Vitamin D deficiency Will recheck vitamin D level here and notify pt of results.      Plan:     See above

## 2019-10-14 LAB — VITAMIN D 25 HYDROXY (VIT D DEFICIENCY, FRACTURES): Vit D, 25-Hydroxy: 49 ng/mL (ref 30–100)

## 2019-11-03 ENCOUNTER — Other Ambulatory Visit: Payer: Self-pay | Admitting: Family Medicine

## 2019-11-03 DIAGNOSIS — Q839 Congenital malformation of breast, unspecified: Secondary | ICD-10-CM

## 2020-04-29 IMAGING — US US THYROID
1 series · 13 of 25 positions shown · non-contrast
Comparison: July 21, 2008

CLINICAL DATA: Thyroid nodule

EXAM:
THYROID ULTRASOUND
TECHNIQUE: Ultrasound examination of the thyroid gland and adjacent soft
tissues was performed.

[Series 1: us thyroid · 0.04mm/px · 13 of 44 slices shown]
[im 1/44]
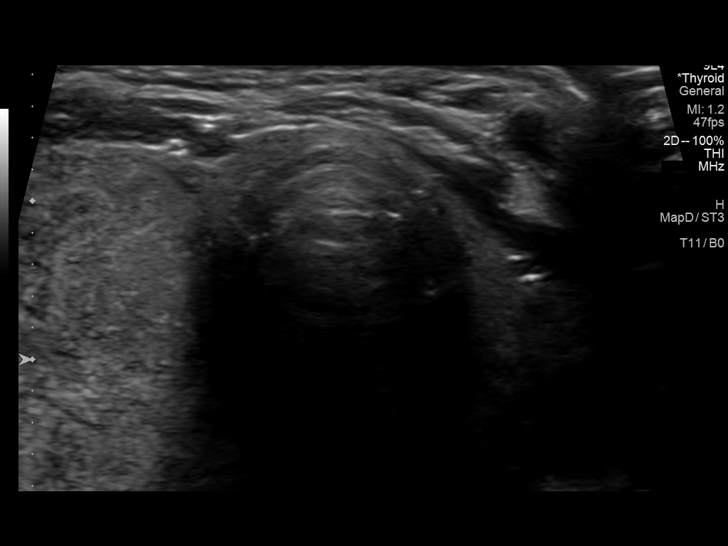
[im 4/44]
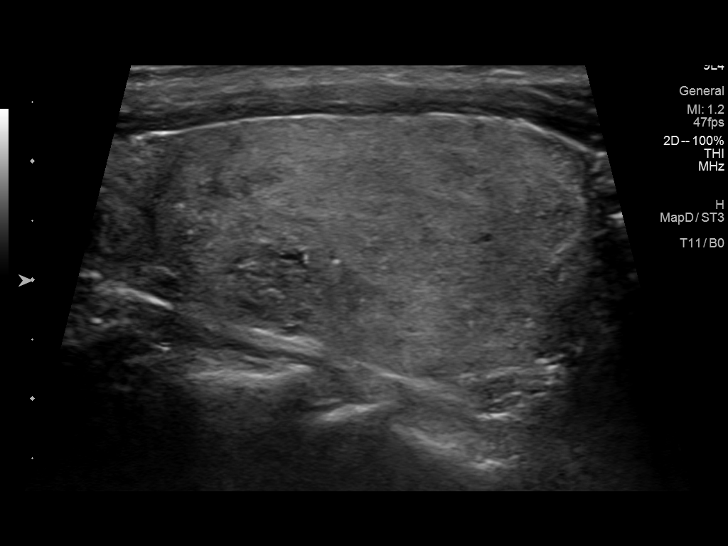
[im 8/44]
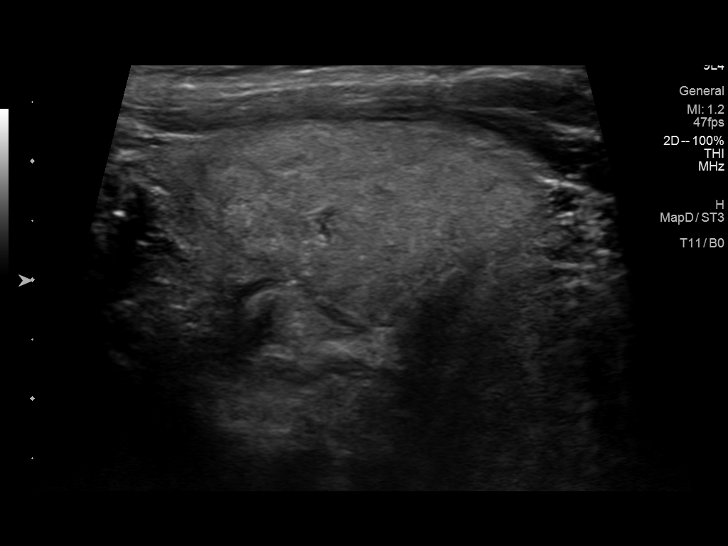
[im 11/44]
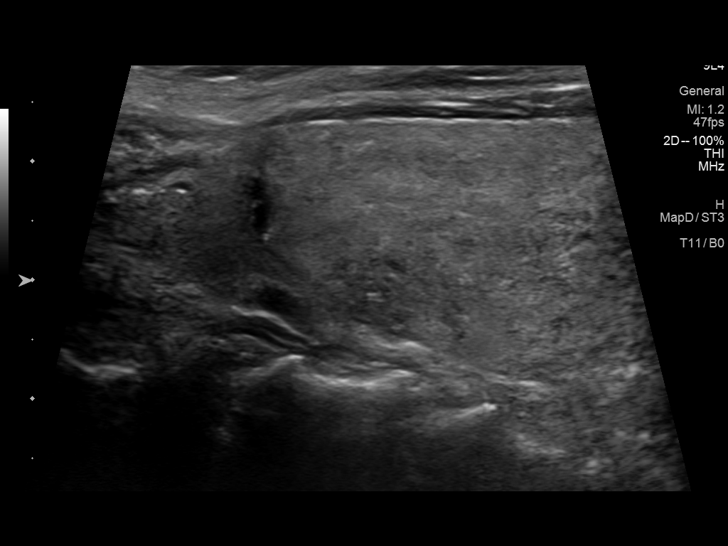
[im 15/44]
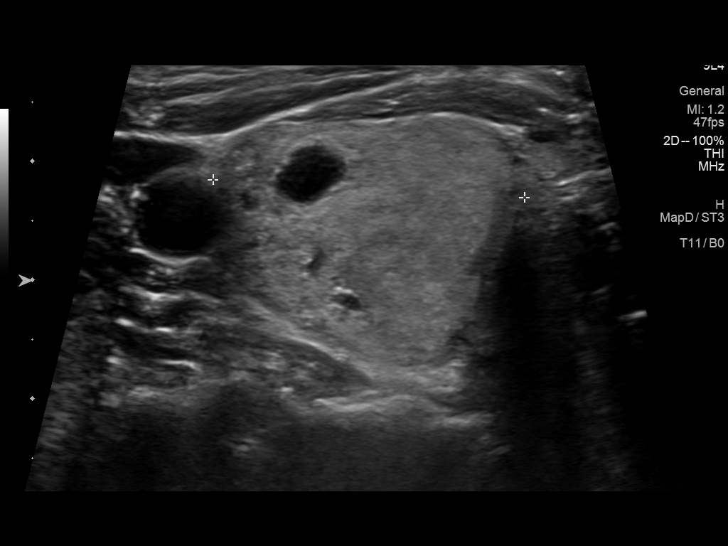
[im 18/44]
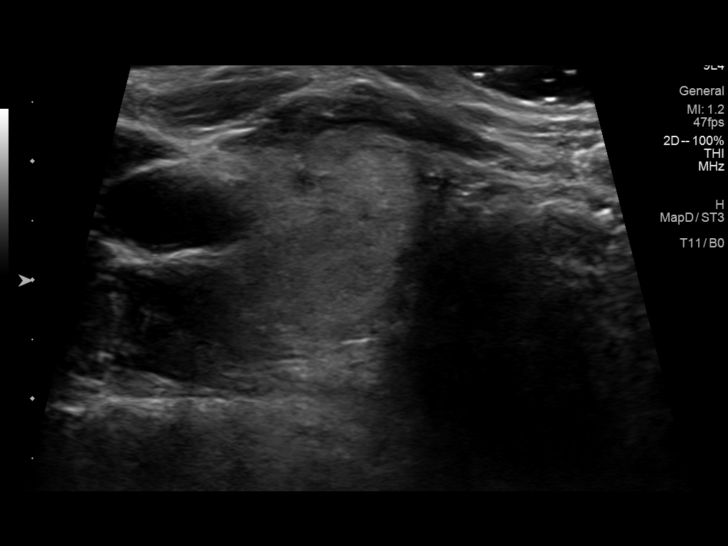
[im 22/44]
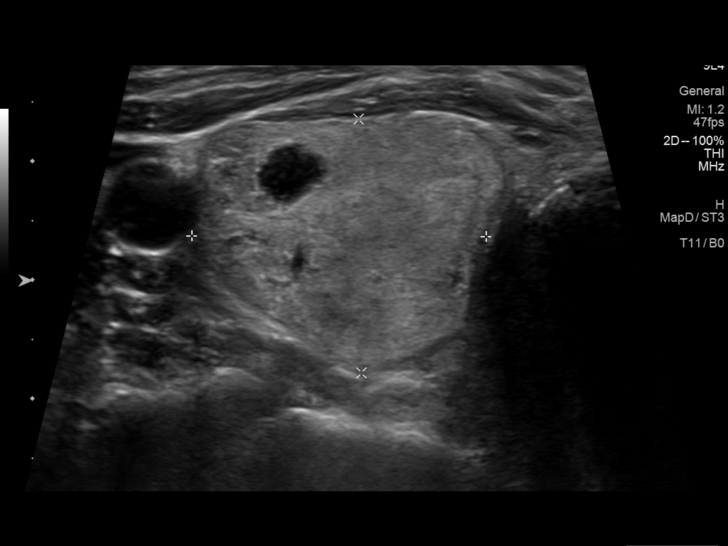
[im 26/44]
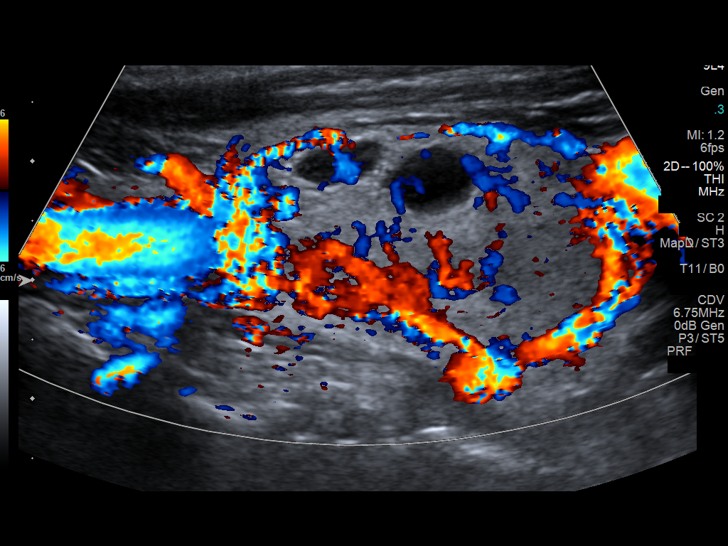
[im 29/44]
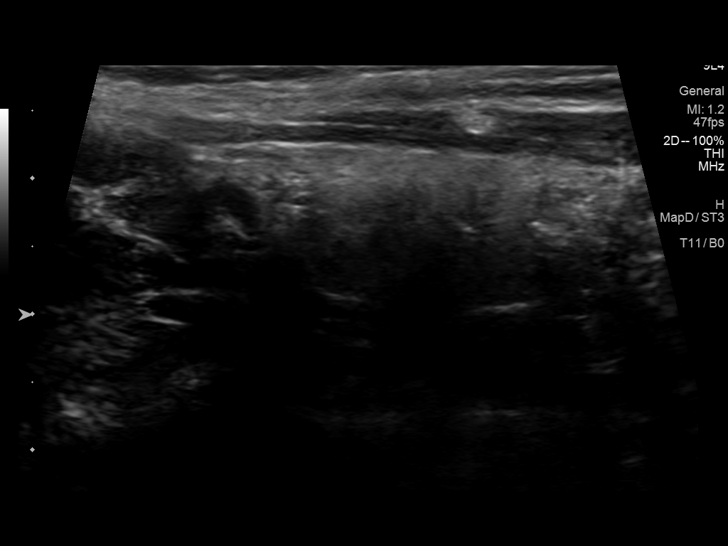
[im 33/44]
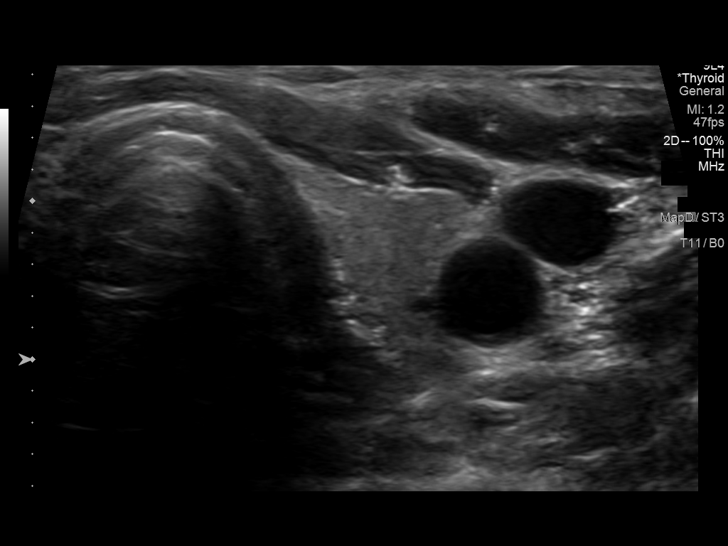
[im 36/44]
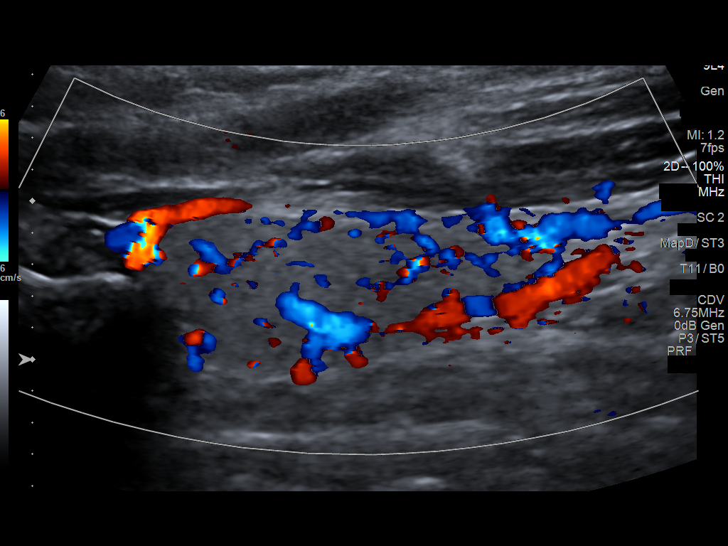
[im 40/44]
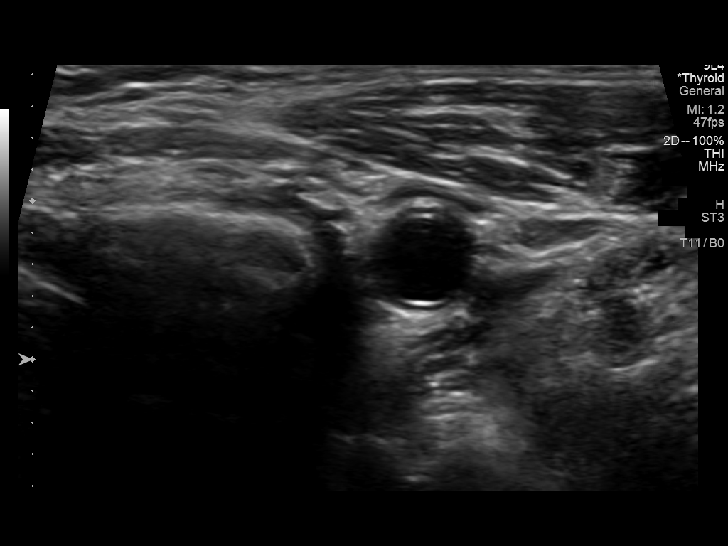
[im 44/44]
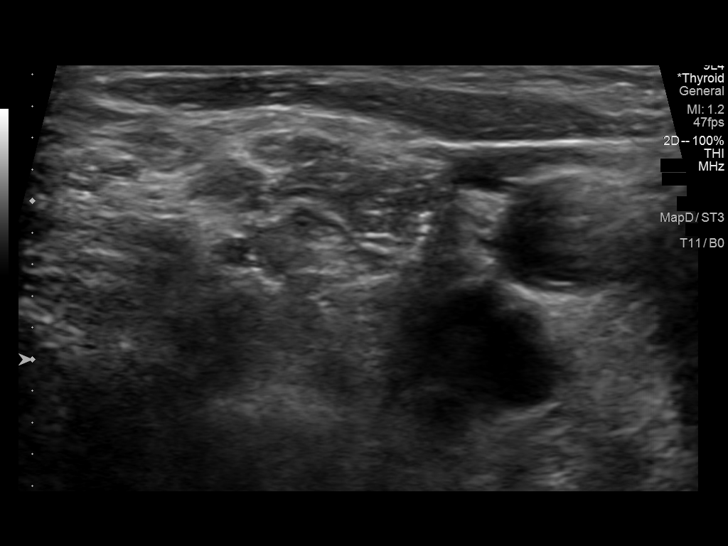

[13 of 25 positions shown; findings below may reference images not displayed]

FINDINGS: Parenchymal Echotexture: Normal

Isthmus: 0.1 cm

Right lobe: 5 x 2.4 x 2.6 cm

Left lobe: 2.9 x 1 x 1 cm

_________________________________________________________

Estimated total number of nodules >/= 1 cm: 1

Number of spongiform nodules >/=  2 cm not described below (TR1): 0

Number of mixed cystic and solid nodules >/= 1.5 cm not described
below (TR2): 0

_________________________________________________________

Nodule # 1:

Prior biopsy: Yes

Location: Right; Mid

Maximum size: 3.9 cm; Other 2 dimensions: 2.5 x 2.1 cm, previously,
3.7 x 2.3 x 1.6 cm

Composition: solid/almost completely solid (2)

Echogenicity: isoechoic (1)

Shape: not taller-than-wide (0)

Margins: smooth (0)

Echogenic foci: none (0)

ACR TI-RADS total points: 3.

ACR TI-RADS risk category:  TR3 (3 points).

Significant change in size (>/= 20% in two dimensions and minimal
increase of 2 mm): No

Change in features: No

Change in ACR TI-RADS risk category: No

ACR TI-RADS recommendations:

**Given size (>/= 2.5 cm) and appearance, fine needle aspiration of
this mildly suspicious nodule should be considered based on TI-RADS
criteria.

_________________________________________________________
IMPRESSION: Stable 3.9 cm TR 3 thyroid nodule in the right thyroid gland. While
this meets criteria for FNA, it was reportedly previously biopsied.
Correlation with prior biopsy results is recommended.

The above is in keeping with the ACR TI-RADS recommendations - [HOSPITAL] 1645;[DATE].

## 2020-08-14 ENCOUNTER — Other Ambulatory Visit: Payer: Self-pay | Admitting: Family Medicine

## 2020-08-14 DIAGNOSIS — Z8619 Personal history of other infectious and parasitic diseases: Secondary | ICD-10-CM

## 2020-08-14 MED ORDER — VALACYCLOVIR HCL 1 G PO TABS: 2000.0000 mg | ORAL_TABLET | Freq: Two times a day (BID) | ORAL | 0 refills | Status: AC

## 2020-08-14 NOTE — Progress Notes (Signed)
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 She reports symptoms came on last night with stinging/burning with redness/bump to bottom right lip. She has been using otc abreva without results.   Past Medical History:  Diagnosis Date  . Asthma   . Celiac disease   . History of ITP    Allergies  Allergen Reactions  . Asa [Aspirin]    Discussed treatment with pt over the phone. She is speaking in complete sentences. Does not appear to be in any acute distress. She is A/O x 3.   H/O cold sores  Will send in rx for valtrex 2g dose bid x 1 day. If no improvement f//u in the next couple days.

## 2020-11-27 ENCOUNTER — Other Ambulatory Visit: Payer: Self-pay | Admitting: Family Medicine

## 2020-11-27 DIAGNOSIS — Z8619 Personal history of other infectious and parasitic diseases: Secondary | ICD-10-CM

## 2020-11-27 MED ORDER — VALACYCLOVIR HCL 1 G PO TABS: 2000.0000 mg | ORAL_TABLET | Freq: Two times a day (BID) | ORAL | 1 refills | Status: AC

## 2020-11-27 NOTE — Progress Notes (Signed)
Donae called the clinic today stating she has another cold sore starting. States valtrex worked well for her last time, which was in December 2021. 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   Past Medical History:  Diagnosis Date  . Asthma   . Celiac disease   . History of ITP    Allergies  Allergen Reactions  . Asa [Aspirin]    Discussed treatment with pt over the phone. Will send in rx to her preferred pharmacy. She is speaking in complete sentences. No acute distress. She is A/O x3.   H/O cold sores  F/u if no improvement over the next couple days after taking valtrex.

## 2020-12-13 ENCOUNTER — Ambulatory Visit: Payer: PRIVATE HEALTH INSURANCE | Admitting: Family Medicine

## 2020-12-13 DIAGNOSIS — Z23 Encounter for immunization: Secondary | ICD-10-CM

## 2020-12-13 NOTE — Progress Notes (Signed)
Kirsten Fleming presents to the employee health and wellness clinic today to receive her Tdap vaccine. She is feeling well today with no complaints. She denies any previous hx of allergic rxn to vaccines. Her last tetanus was approx 10 years ago per pt. Please see immunizations for admin documentation. Pt tolerated well without problem. VIS given. F/u prn   Need for Tdap vaccination

## 2020-12-20 ENCOUNTER — Ambulatory Visit: Payer: PRIVATE HEALTH INSURANCE | Admitting: Family Medicine

## 2020-12-20 DIAGNOSIS — Z23 Encounter for immunization: Secondary | ICD-10-CM

## 2020-12-20 NOTE — Progress Notes (Signed)
Kirsten Fleming presents today to the employee health and wellness clinic for her first shingrix vaccine. She denies any allergy to egg or previous vaccines.  She is feeling well today with no complaints. See immunization record for administration details. VIS given. Tolerated well.   Need for shingles vaccine  F/u in 2-6 months for 2nd vaccine.

## 2021-10-22 ENCOUNTER — Other Ambulatory Visit: Payer: Self-pay | Admitting: Family Medicine

## 2021-10-22 DIAGNOSIS — M85851 Other specified disorders of bone density and structure, right thigh: Secondary | ICD-10-CM

## 2021-12-05 ENCOUNTER — Other Ambulatory Visit: Payer: Self-pay | Admitting: Family Medicine

## 2021-12-05 DIAGNOSIS — M85851 Other specified disorders of bone density and structure, right thigh: Secondary | ICD-10-CM

## 2021-12-05 DIAGNOSIS — E782 Mixed hyperlipidemia: Secondary | ICD-10-CM
